# Patient Record
Sex: Male | Born: 2000 | Race: Black or African American | Hispanic: No | Marital: Single | Smoking: Never smoker
Health system: Southern US, Community
[De-identification: ages and names within clinical notes are randomized; demographics above are authoritative.]

## PROBLEM LIST (undated history)

## (undated) DIAGNOSIS — T7840XA Allergy, unspecified, initial encounter: Secondary | ICD-10-CM

## (undated) HISTORY — DX: Allergy, unspecified, initial encounter: T78.40XA

## (undated) HISTORY — PX: CIRCUMCISION: SUR203

---
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
## 2023-04-22 ENCOUNTER — Encounter: Payer: Self-pay | Admitting: *Deleted

## 2024-04-28 ENCOUNTER — Encounter: Payer: Self-pay | Admitting: *Deleted
